# Patient Record
Sex: Male | Born: 1980 | Race: White | Hispanic: No | Marital: Single | Smoking: Current every day smoker
Health system: Southern US, Community
[De-identification: ages and names within clinical notes are randomized; demographics above are authoritative.]

---

## 2004-07-10 ENCOUNTER — Emergency Department: Payer: Self-pay | Admitting: Emergency Medicine

## 2005-10-19 ENCOUNTER — Emergency Department: Payer: Self-pay | Admitting: Emergency Medicine

## 2005-11-04 ENCOUNTER — Emergency Department: Payer: Self-pay | Admitting: Emergency Medicine

## 2006-01-02 ENCOUNTER — Emergency Department: Payer: Self-pay | Admitting: Unknown Physician Specialty

## 2006-05-31 ENCOUNTER — Emergency Department: Payer: Self-pay | Admitting: Emergency Medicine

## 2006-06-08 ENCOUNTER — Emergency Department: Payer: Self-pay | Admitting: Emergency Medicine

## 2006-07-17 ENCOUNTER — Emergency Department: Payer: Self-pay | Admitting: Internal Medicine

## 2006-12-05 ENCOUNTER — Emergency Department: Payer: Self-pay | Admitting: Emergency Medicine

## 2008-03-28 IMAGING — CR LEFT LITTLE FINGER 2+V
1 series · 3 of 3 positions shown · non-contrast
Comparison: none

REASON FOR EXAM: Fall
COMMENTS:   LMP: (Male)

[Series 1: view not recorded · 0.17mm/px · 3 of 3 slices shown]
[im 1/3]
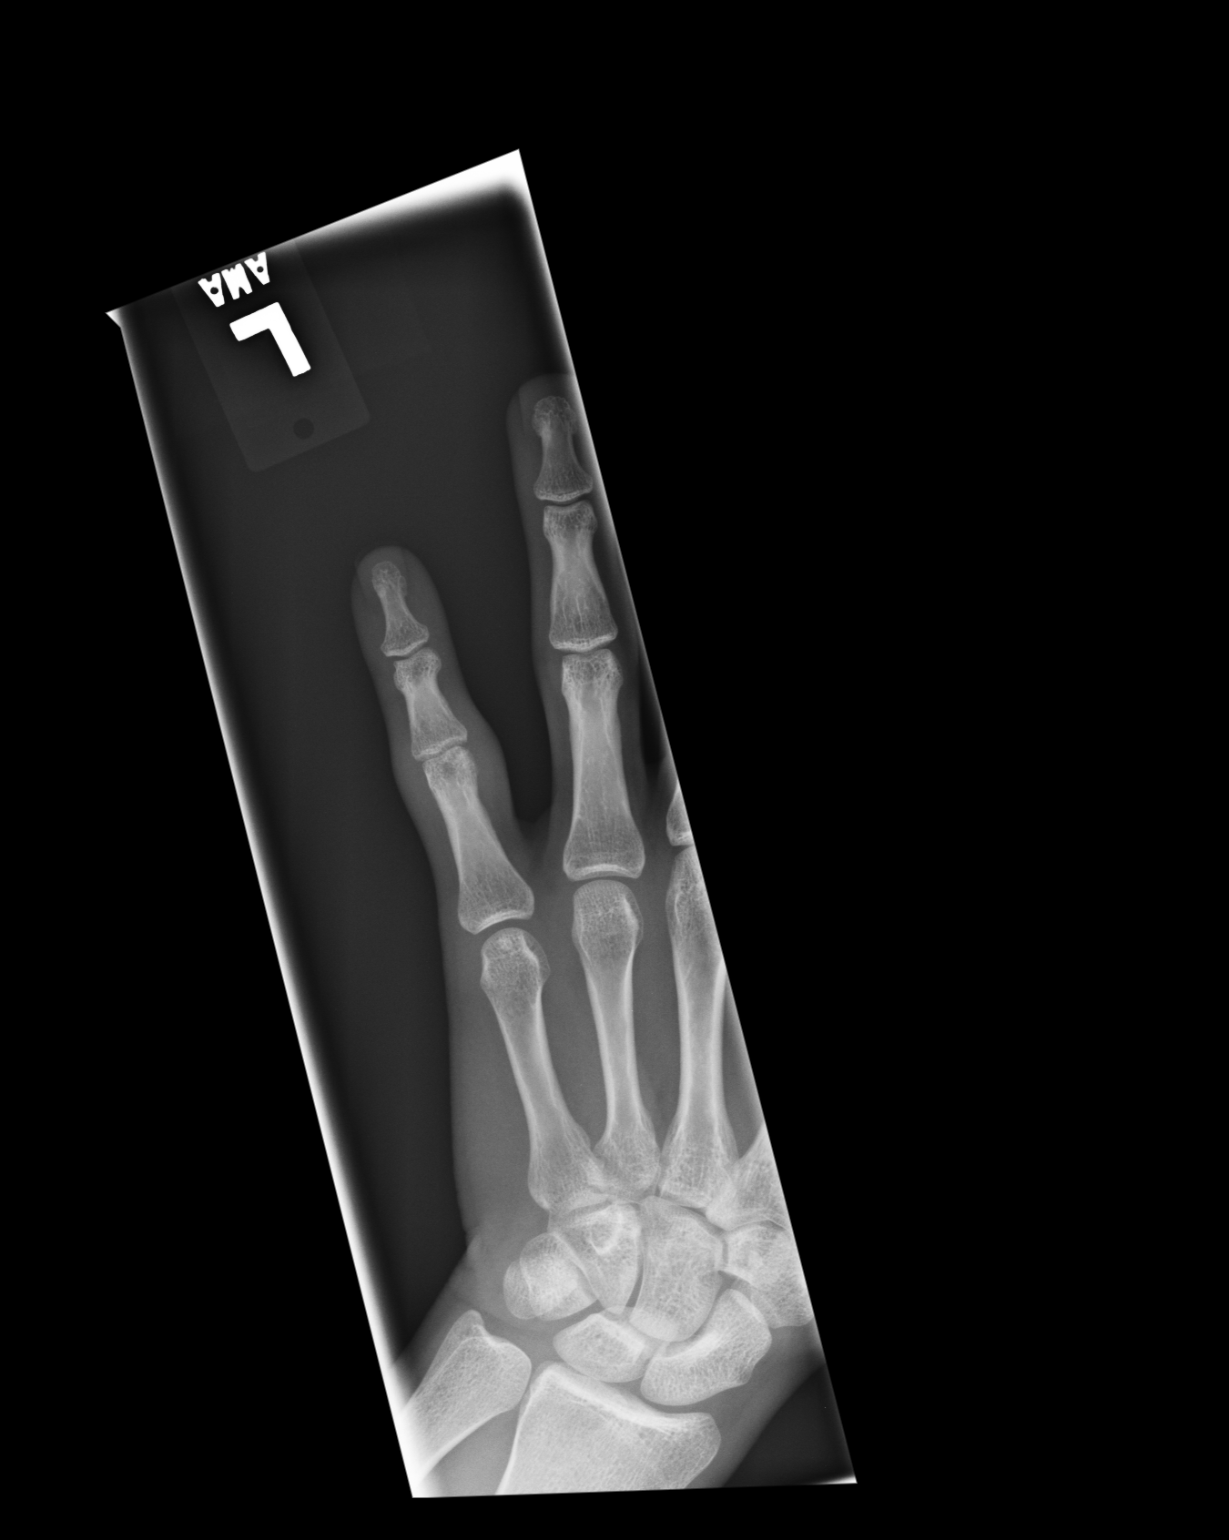
[im 2/3]
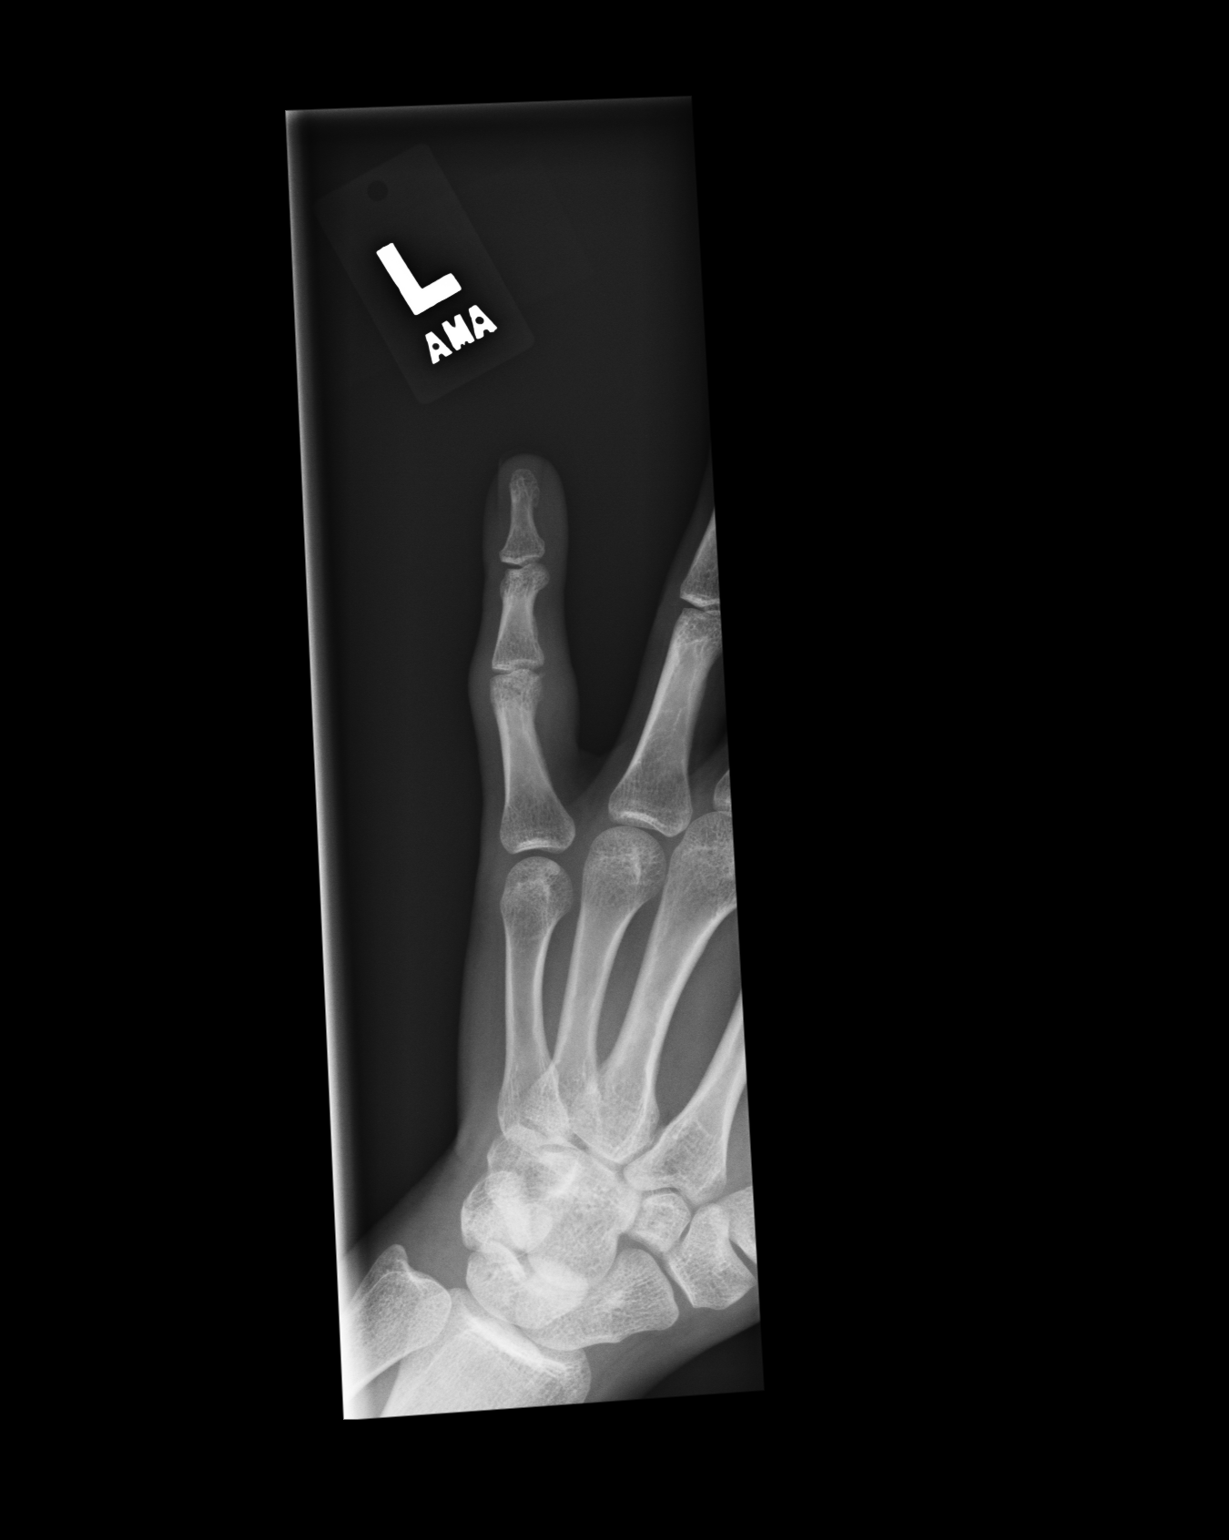
[im 3/3]
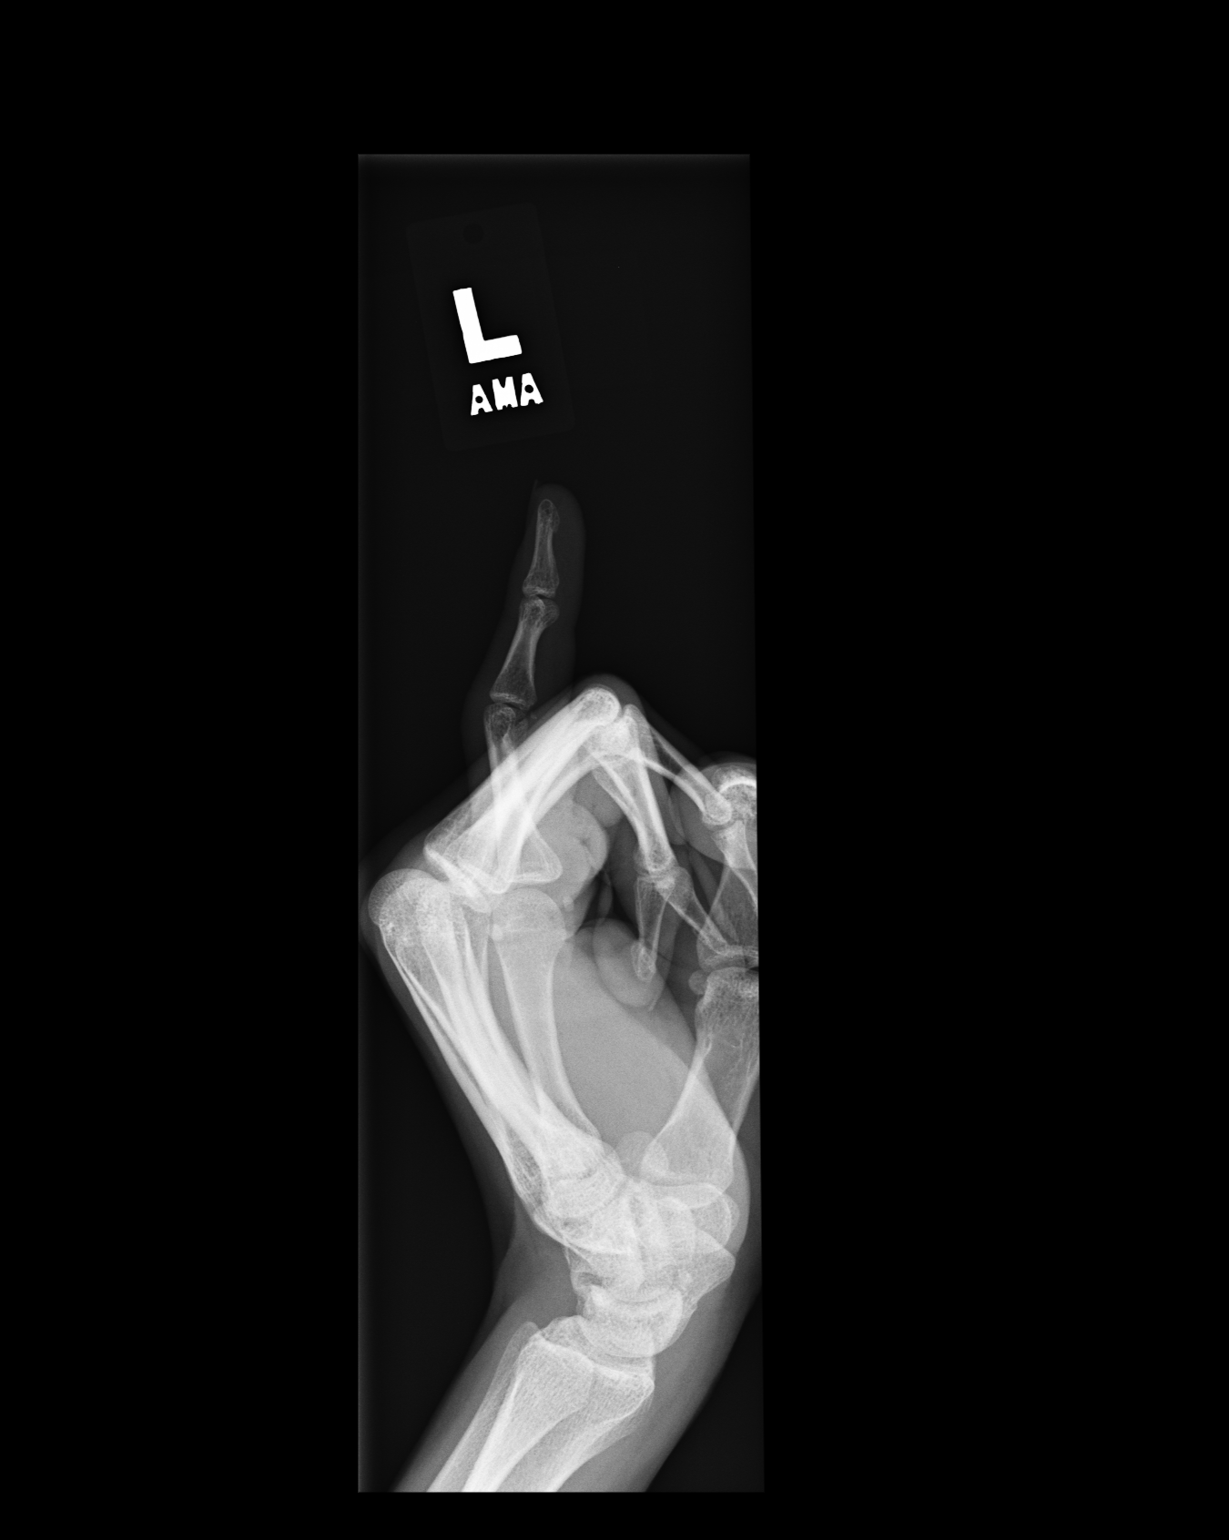

[3 of 3 positions shown; findings below may reference images not displayed]

PROCEDURE:     DXR - DXR FINGER PINKY 5TH DIGIT LT HA  - December 05, 2006  [DATE]

RESULT:     There is soft tissue swelling over the PIP joint of the LEFT
fifth finger. There may be a tiny avulsion fracture from the base of the
middle phalanx of the fifth finger seen on the lateral film. This can be
faintly demonstrated on the oblique view as well.
IMPRESSION: I am suspicious that there is a tiny avulsion from the base of the middle
phalanx of the LEFT fifth finger. There is considerable soft tissue swelling
over the PIP joint. Lucency is seen through the distal aspect of the
proximal phalanx on the oblique view but is not clearly evident on the other
films and is not felt to definitely reflect a fracture. Followup imaging is
needed to better assess the ventral aspect of the middle phalanx. The fourth
and third digits do overlap this region somewhat. A repeat lateral film is
available at no charge to the patient.

## 2008-03-28 IMAGING — CR DG CHEST 2V
1 series · 2 of 2 positions shown · non-contrast
Comparison: none

REASON FOR EXAM: Fall
COMMENTS:   LMP: (Male)

[Series 1: view not recorded · 0.17mm/px · 2 of 2 slices shown]
[im 1/2]
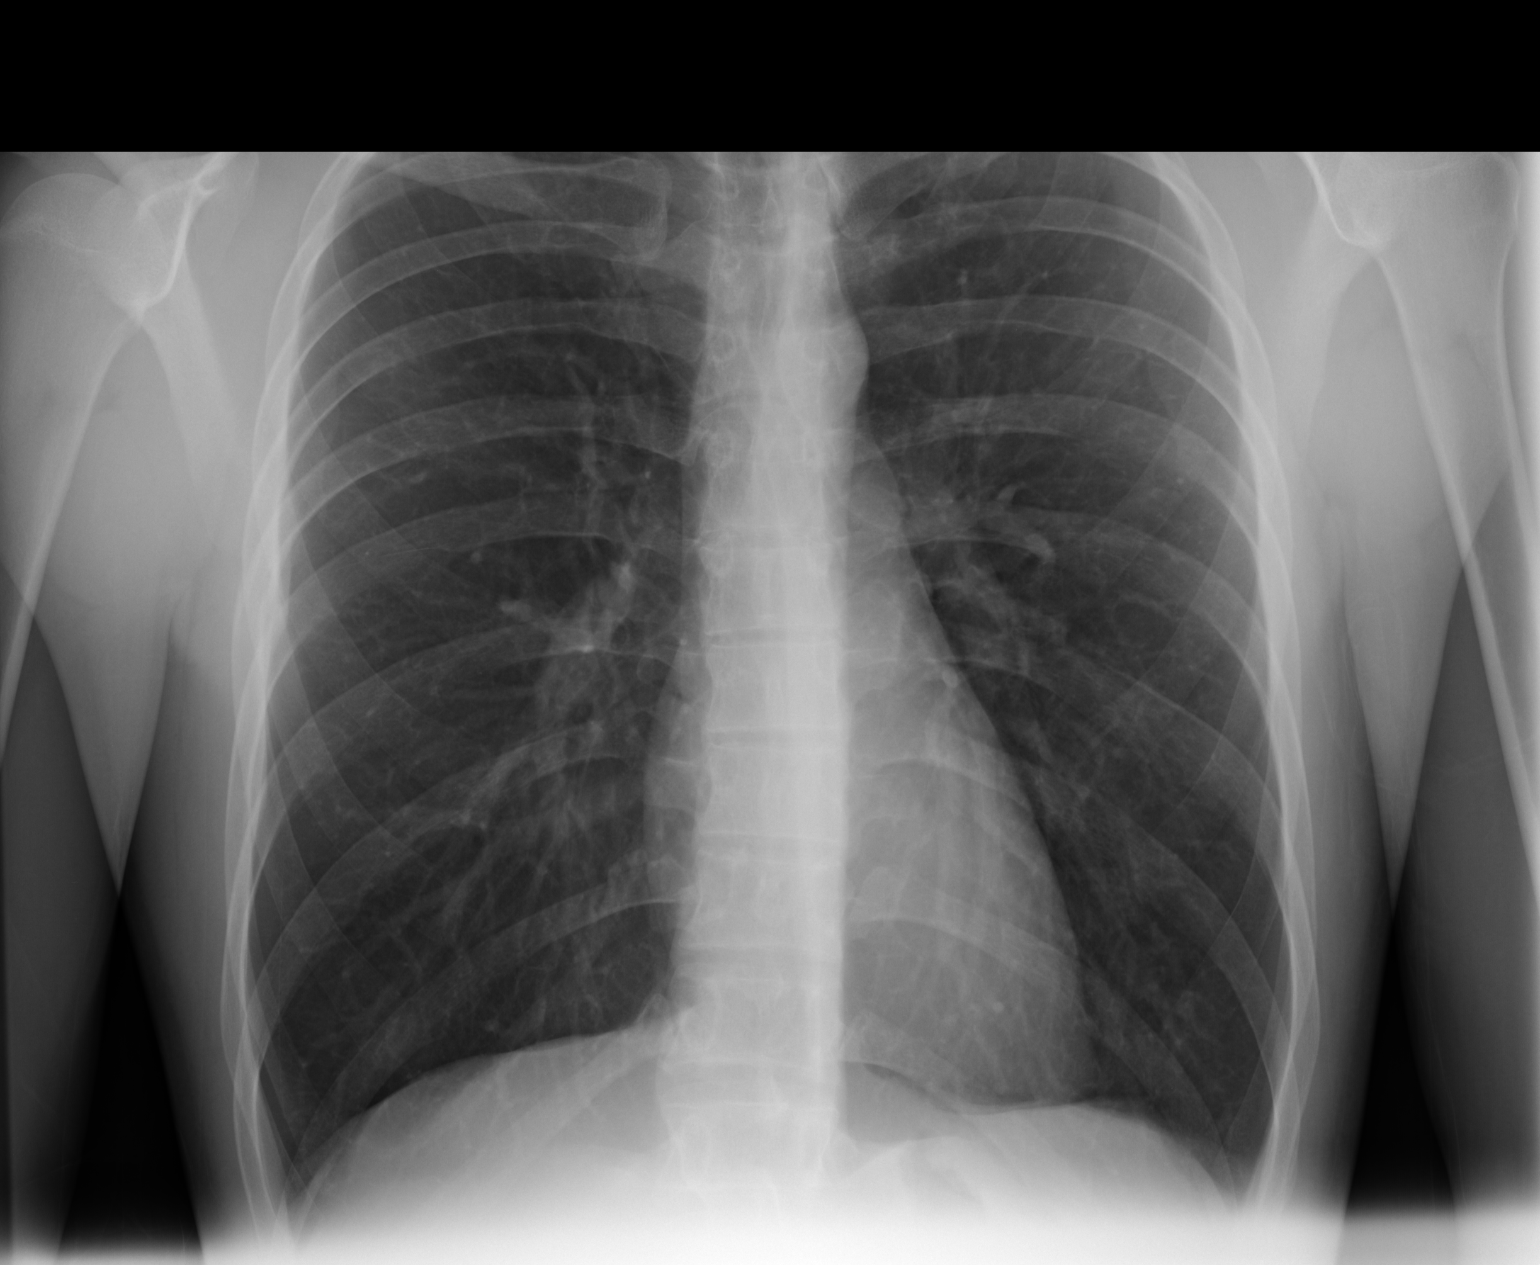
[im 2/2]
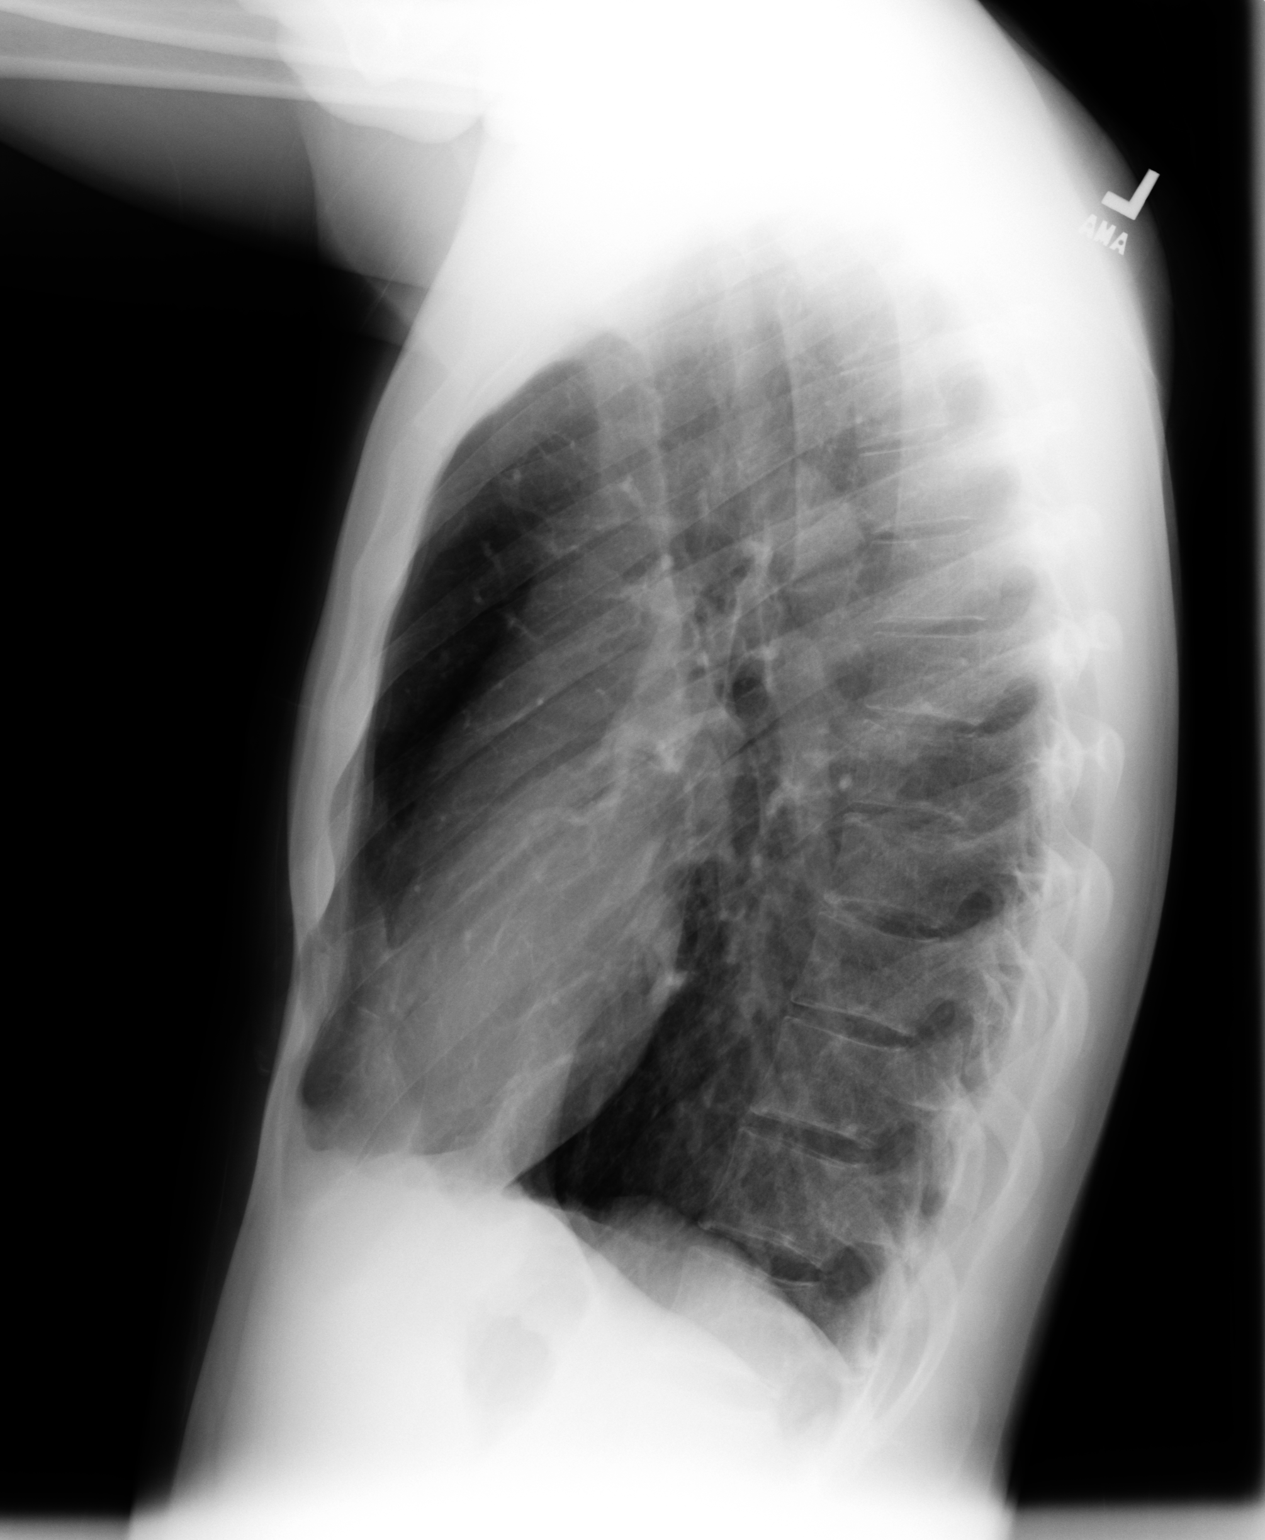

[2 of 2 positions shown; findings below may reference images not displayed]

PROCEDURE:     DXR - DXR CHEST PA (OR AP) AND LATERAL  - December 05, 2006  [DATE]

RESULT:     Comparison is made to the study of 11/04/05.  The patient
sustained injury in a fall.

The lungs are hyperinflated. There is no focal infiltrate. I see no evidence
of a pneumothorax or of a hemothorax. The mediastinum is not widened. The
cardiac silhouette is normal in size. The thoracic vertebral bodies are
preserved in height. No displaced rib fracture is seen though the complete
rib cage is not demonstrated.
IMPRESSION: There is hyperinflation consistent with reactive airway disease or deep
inspiratory effort. I do not see evidence of acute thoracic trauma.

## 2008-03-28 IMAGING — CR DG RIBS 2V*R*
1 series · 2 of 2 positions shown · non-contrast
Comparison: none

REASON FOR EXAM: fall
COMMENTS:   LMP: (Male)

[Series 1: view not recorded · 0.17mm/px · 2 of 2 slices shown]
[im 1/2]
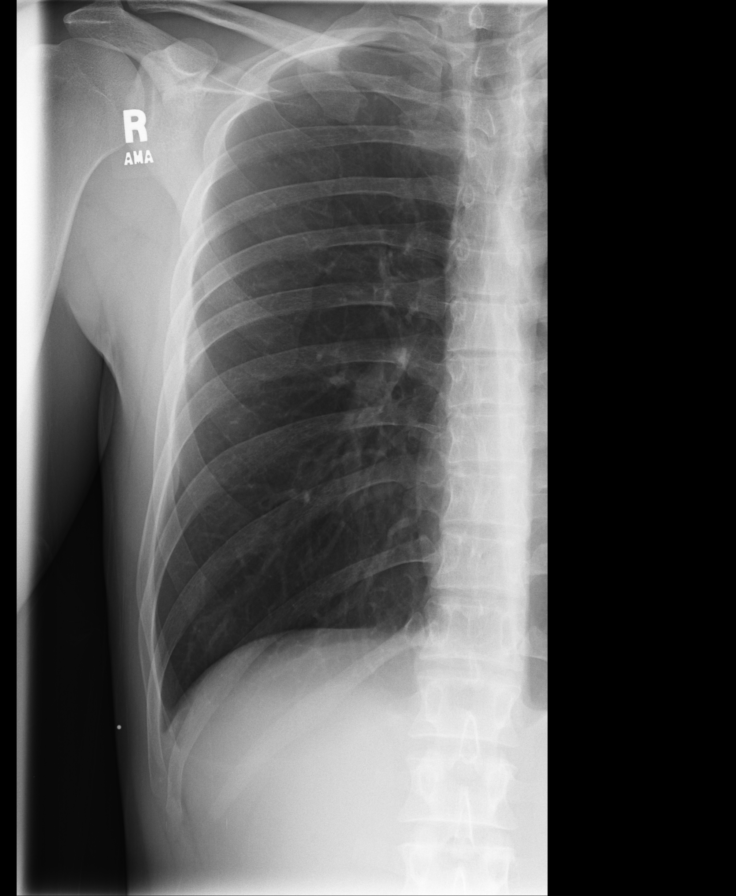
[im 2/2]
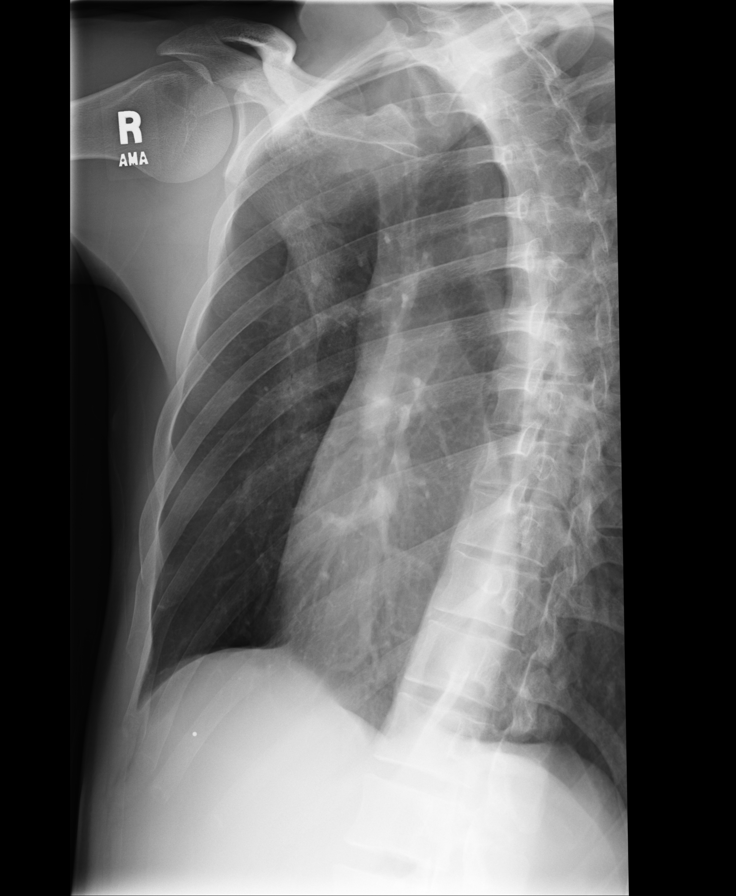

[2 of 2 positions shown; findings below may reference images not displayed]

PROCEDURE:     DXR - DXR RIBS RIGHT UNILATERAL  - December 05, 2006  [DATE]

RESULT:     There may be a subtle cortical disruption involving the lateral
aspect of the right 7th rib. It is seen only on the AP view. I do not see
definite fracture involving other ribs. There is no pneumothorax and no
evidence of a pleural effusion.
IMPRESSION: There may be a nondisplaced fracture of the lateral aspect
of the right 7th rib. I see no pneumothorax or hemothorax.

## 2009-12-17 ENCOUNTER — Emergency Department: Payer: Self-pay | Admitting: Unknown Physician Specialty

## 2013-03-07 ENCOUNTER — Emergency Department: Payer: Self-pay | Admitting: Emergency Medicine

## 2013-12-04 ENCOUNTER — Emergency Department: Payer: Self-pay | Admitting: Emergency Medicine

## 2014-06-29 IMAGING — CR LEFT FOURTH TOE
1 series · 3 of 3 positions shown · non-contrast
Comparison: none

REASON FOR EXAM: hit yesterday, swelling, painful WB, TTP
COMMENTS:

PROCEDURE:     DXR - DXR TOE 4TH DIGIT LEFT FOOT  - March 07, 2013 [DATE]
RESULT:     Comparison: None.

[Series 1: ap · 0.17mm/px · 3 of 3 slices shown]
[im 1/3]
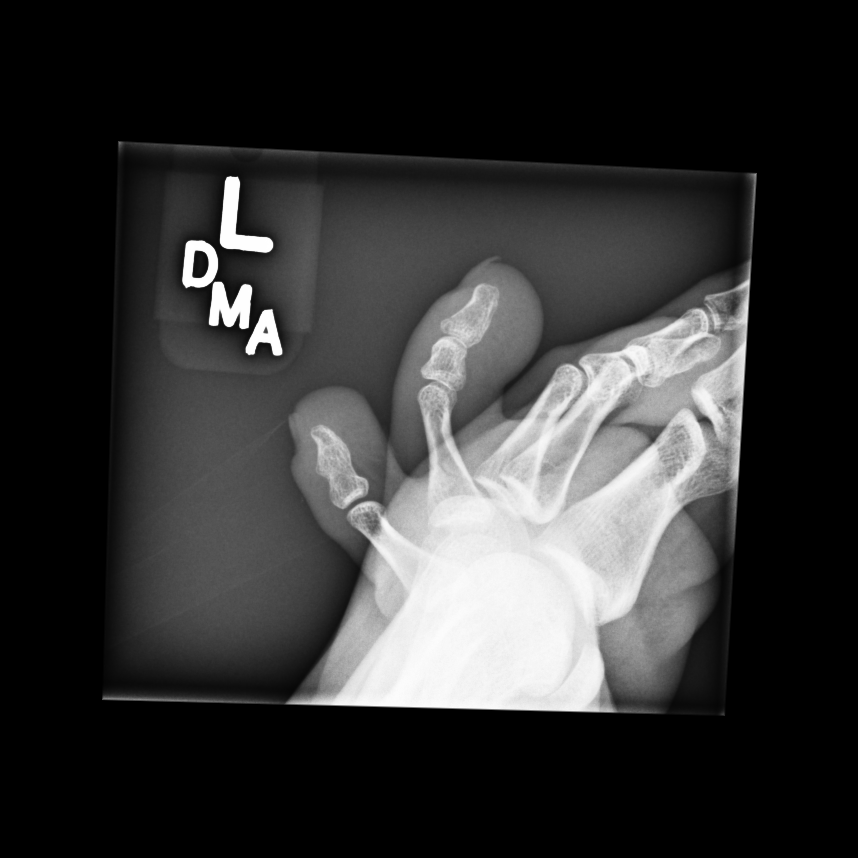
[im 2/3]
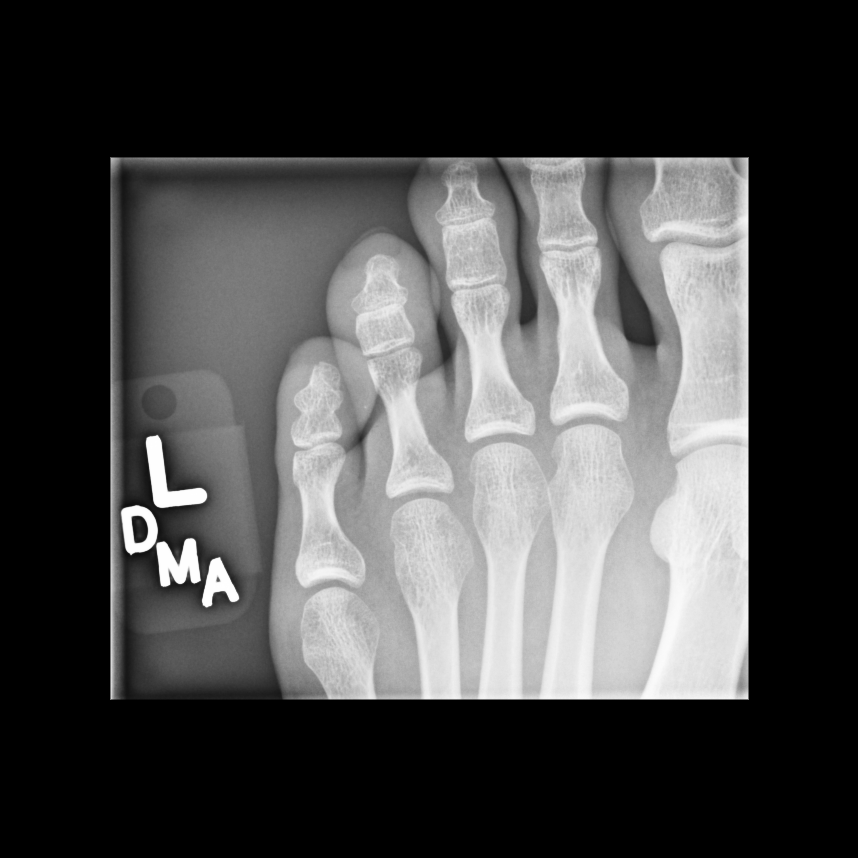
[im 3/3]
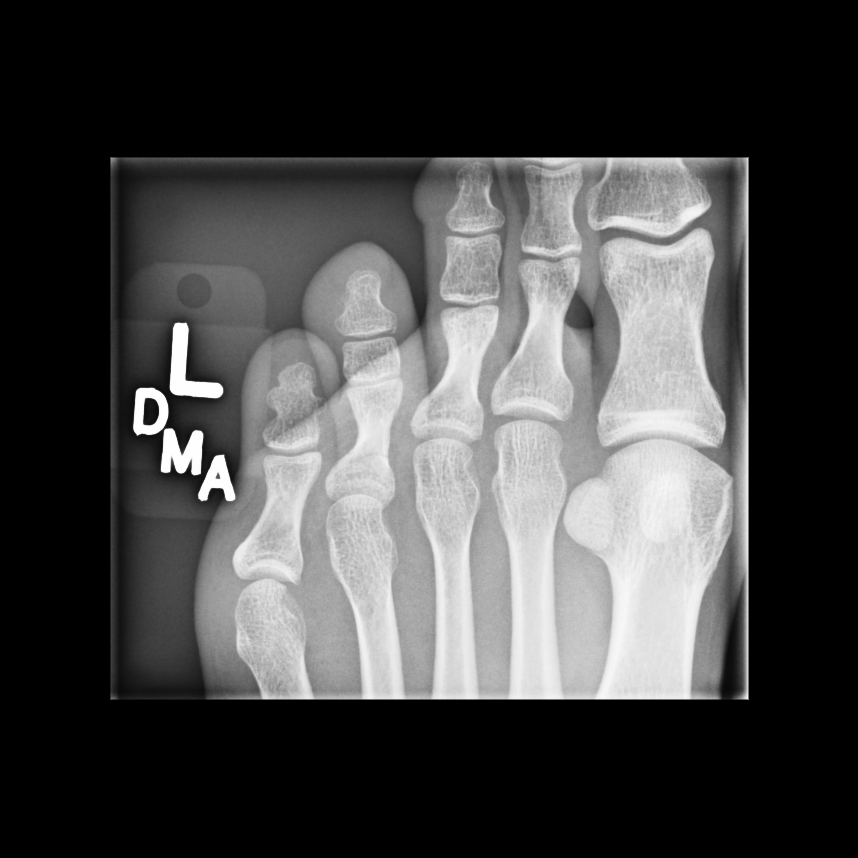

[3 of 3 positions shown; findings below may reference images not displayed]

FINDINGS: There is a nondisplaced fracture of the proximal phalanx of the fourth toe.
IMPRESSION: Nondisplaced fracture of the proximal phalanx fourth toe.

[REDACTED]

## 2015-03-28 IMAGING — CR DG RIBS 2V*R*
1 series · 5 of 5 positions shown · non-contrast
Comparison: Chest radiograph December 05, 2006

CLINICAL DATA: Pain post trauma

EXAM:
RIGHT RIBS - 4 VIEW

[Series 1: w chest pa · 0.14mm/px · 5 of 5 slices shown]
[im 1/5]
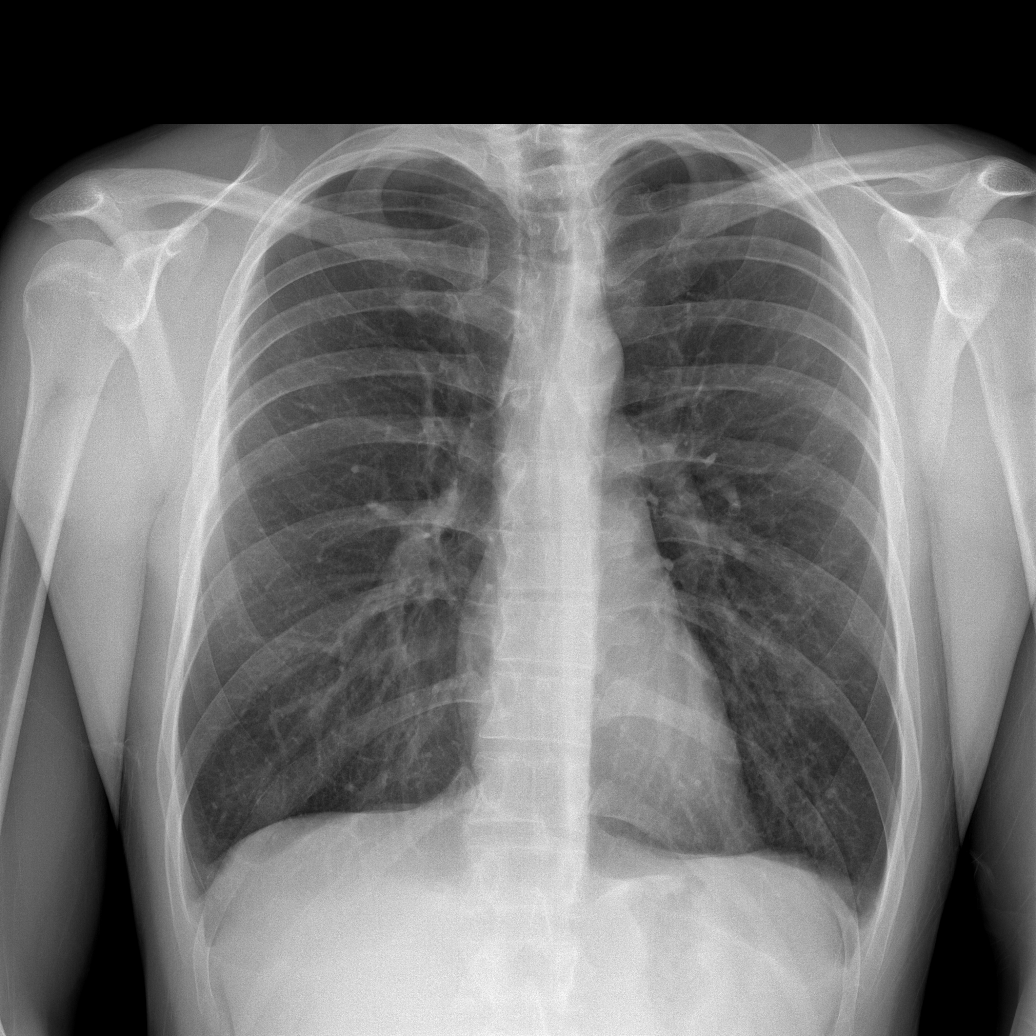
[im 2/5]
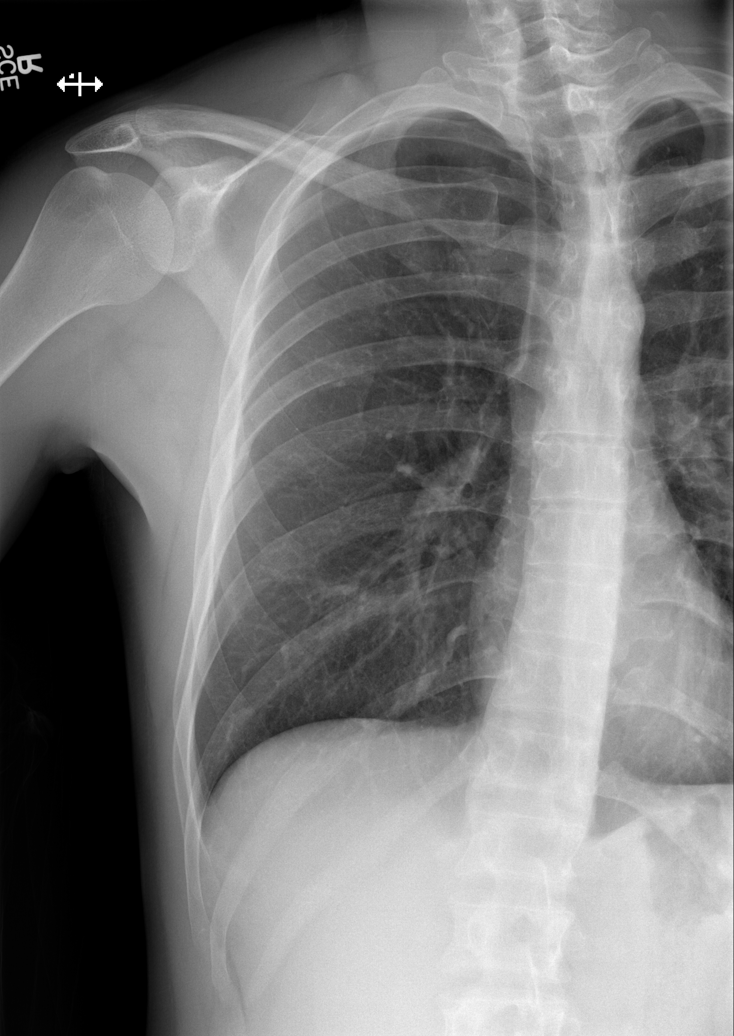
[im 3/5]
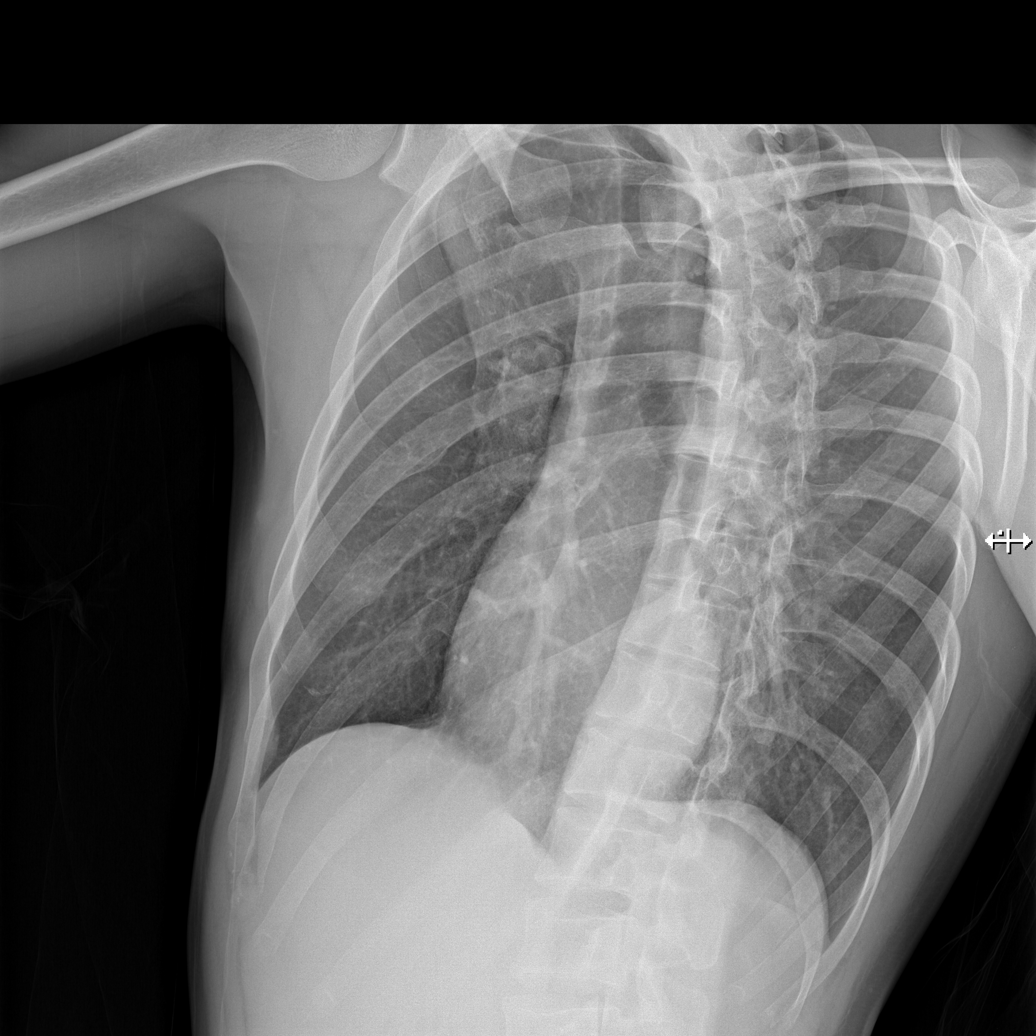
[im 4/5]
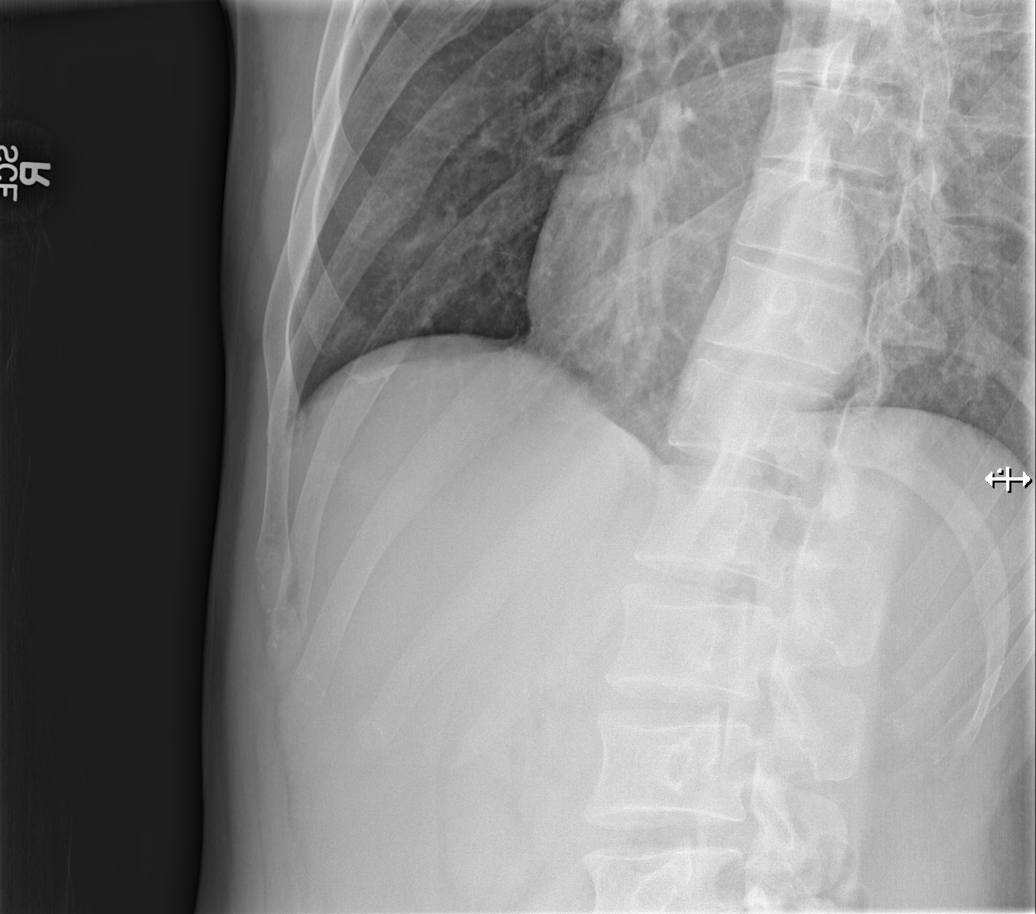
[im 5/5]
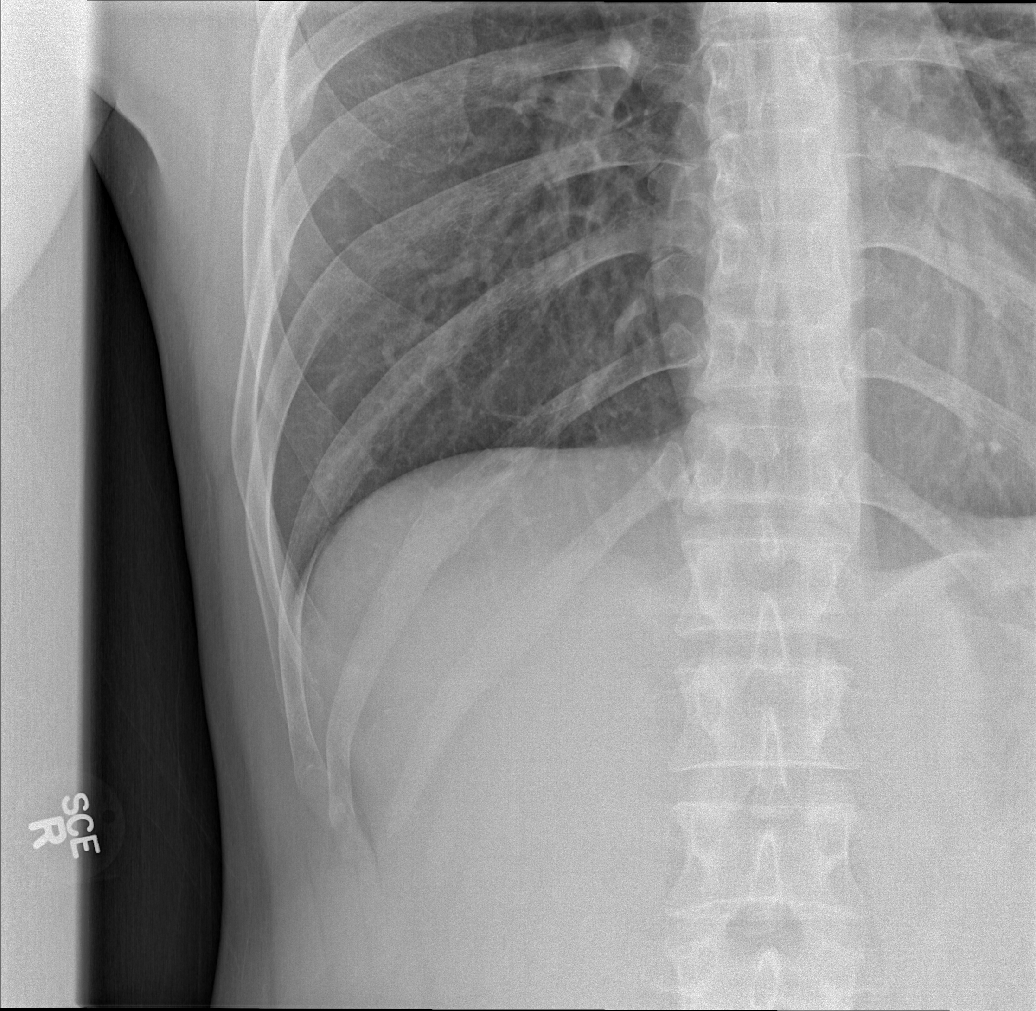

[5 of 5 positions shown; findings below may reference images not displayed]

FINDINGS: Frontal chest as well as bilateral oblique and cone-down lower rib
images were obtained. Lungs are clear. Heart size and pulmonary
vascularity are normal. No adenopathy.

There is mild upper thoracic levoscoliosis. There is evidence of old
healed fractures of the lateral right seventh and eighth ribs.

There is no appreciable pleural effusion or pneumothorax. There is a
rather subtle nondisplaced acute fracture of the right posterior
eleventh rib.
IMPRESSION: Nondisplaced fracture of the right posterior eleventh rib, acute.
Old fractures of lateral seventh and eighth ribs the right. No
pneumothorax. Lungs clear.

## 2020-10-02 ENCOUNTER — Emergency Department
Admission: EM | Admit: 2020-10-02 | Discharge: 2020-10-02 | Disposition: A | Discharge status: Home / Self Care | Payer: Self-pay | Attending: Emergency Medicine | Admitting: Emergency Medicine

## 2020-10-02 ENCOUNTER — Other Ambulatory Visit: Payer: Self-pay

## 2020-10-02 DIAGNOSIS — F17210 Nicotine dependence, cigarettes, uncomplicated: Secondary | ICD-10-CM | POA: Insufficient documentation

## 2020-10-02 DIAGNOSIS — T50901A Poisoning by unspecified drugs, medicaments and biological substances, accidental (unintentional), initial encounter: Secondary | ICD-10-CM | POA: Insufficient documentation

## 2020-10-02 LAB — CBG MONITORING, ED: Glucose-Capillary: 87 mg/dL (ref 70–99)

## 2020-10-02 NOTE — ED Triage Notes (Signed)
Pt found unresponsive by fire w weak carotid pulses, pinpoint pupils, bagged him, gave 1 of narcan. Pupils normal now. Cooperative at this time. Pt friend called [phone]. RR n18 HR 95

## 2020-10-02 NOTE — ED Provider Notes (Addendum)
[facility] Emergency Department Provider Note  ____________________________________________   Event Date/Time   First MD Initiated Contact with Patient 10/02/20 [phone]     (approximate)  I have reviewed the triage vital signs and the nursing notes.   HISTORY  Chief Complaint Drug Overdose    HPI Tyrone Shelton is a 40 y.o. male history of substance abuse who presents to the emergency department with EMS after an accidental drug overdose.  He states at some point last night he took several Xanax and Suboxone in an attempt to get high.  He also drink alcohol.  Reports he had a friend over at the house helping him clean the house.  Friend called [phone] patient was unresponsive, apneic.  Patient received Narcan with EMS and was bagged.  He was not pulseless.  He is now awake and alert without any complaints.  He denies that this was an attempt to harm himself.  Patient tells me that his fiance is currently in the hospital.  She is admitted for DKA.  He was at home with his friend and his 95-year-old daughter.  Currently the 58-year-old daughter, Tyrone Shelton, is with the sheriff's department.        History reviewed. No pertinent past medical history.  There are no problems to display for this patient.   History reviewed. No pertinent surgical history.  Prior to Admission medications   Not on File    Allergies Patient has no allergy information on record.  No family history on file.  Social History Social History   Tobacco Use  . Smoking status: Current Every Day Smoker    Packs/day: 0.50    Types: Cigarettes  . Smokeless tobacco: Never Used  Substance Use Topics  . Alcohol use: Yes    Alcohol/week: 4.0 standard drinks    Types: 4 Cans of beer per week  . Drug use: Yes    Types: Marijuana    Review of Systems Constitutional: No fever. Eyes: No visual changes. ENT: No sore throat. Cardiovascular: Denies chest pain. Respiratory: Denies  shortness of breath. Gastrointestinal: No nausea, vomiting, diarrhea. Genitourinary: Negative for dysuria. Musculoskeletal: Negative for back pain. Skin: Negative for rash. Neurological: Negative for focal weakness or numbness.  ____________________________________________   PHYSICAL EXAM:  VITAL SIGNS: ED Triage Vitals [10/02/20 0341]  Enc Vitals Group     BP 130/88     Pulse Rate 99     Resp 14     Temp 97.6 F (36.4 C)     Temp Source Oral     SpO2 98 %     Weight 160 lb (72.6 kg)     Height 6' (1.829 m)     Head Circumference      Peak Flow      Pain Score 0     Pain Loc      Pain Edu?      Excl. in GC?    CONSTITUTIONAL: Alert and oriented and responds appropriately to questions. Well-appearing; well-nourished HEAD: Normocephalic EYES: Conjunctivae clear, pupils appear equal, EOM appear intact ENT: normal nose; moist mucous membranes NECK: Supple, normal ROM CARD: RRR; S1 and S2 appreciated; no murmurs, no clicks, no rubs, no gallops RESP: Normal chest excursion without splinting or tachypnea; breath sounds clear and equal bilaterally; no wheezes, no rhonchi, no rales, no hypoxia or respiratory distress, speaking full sentences ABD/GI: Normal bowel sounds; non-distended; soft, non-tender, no rebound, no guarding, no peritoneal signs, no hepatosplenomegaly BACK: The back appears normal  EXT: Normal ROM in all joints; no deformity noted, no edema; no cyanosis SKIN: Normal color for age and race; warm; no rash on exposed skin NEURO: Moves all extremities equally PSYCH: The patient's mood and manner are appropriate.  Denies SI.  ____________________________________________   LABS (all labs ordered are listed, but only abnormal results are displayed)  Labs Reviewed  CBG MONITORING, ED   ____________________________________________  EKG  None ____________________________________________  RADIOLOGY I, Joliene Salvador, personally viewed and evaluated these images  (plain radiographs) as part of my medical decision making, as well as reviewing the written report by the radiologist.  ED MD interpretation:  none  Official radiology report(s): No results found.  ____________________________________________   PROCEDURES  Procedure(s) performed (including Critical Care):  Procedures  CRITICAL CARE Performed by: Rochele Raring   Total critical care time: 35 minutes  Critical care time was exclusive of separately billable procedures and treating other patients.  Critical care was necessary to treat or prevent imminent or life-threatening deterioration.  Critical care was time spent personally by me on the following activities: development of treatment plan with patient and/or surrogate as well as nursing, discussions with consultants, evaluation of patient's response to treatment, examination of patient, obtaining history from patient or surrogate, ordering and performing treatments and interventions, ordering and review of laboratory studies, ordering and review of radiographic studies, pulse oximetry and re-evaluation of patient's condition.  ____________________________________________   INITIAL IMPRESSION / ASSESSMENT AND PLAN / ED COURSE  As part of my medical decision making, I reviewed the following data within the electronic MEDICAL RECORD NUMBER History obtained from family, Labs reviewed, Old chart reviewed, Notes from prior ED visits and [place] Controlled Substance Database         Patient here after accidental overdose.  Will monitor here in the ED.  Will allow him to eat and drink.  He has no complaints at this time and is hemodynamically stable.  Will check blood glucose.  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  I will contact CPS.  This chart will not be shared with patient given concerns for  daughter safety.  4:20 AM  Discussed with Morrie Sheldon with [place] [employer] regarding patient's daughter Tyrone Shelton.  Confirmed with nurse that  [employer] is with child.   5:45 AM  Pt has been monitored for over 2 hours.  Eating and drinking without difficulty.  Normal blood glucose.  No complaints at this time.  Hemodynamically stable.  No apnea, unresponsiveness, hypoxia. Ambulates without difficulty with quick and steady gait. Will discharge with outpatient resources.  At this time, I do not feel there is any life-threatening condition present. I have reviewed, interpreted and discussed all results (EKG, imaging, lab, urine as appropriate) and exam findings with patient/family. I have reviewed nursing notes and appropriate previous records.  I feel the patient is safe to be discharged home without further emergent workup and can continue workup as an outpatient as needed. Discussed usual and customary return precautions. Patient/family verbalize understanding and are comfortable with this plan.  Outpatient follow-up has been provided as needed. All questions have been answered.  ____________________________________________   FINAL CLINICAL IMPRESSION(S) / ED DIAGNOSES  Final diagnoses:  Accidental overdose, initial encounter     ED Discharge Orders    None      *Please note:  RAEQWON LUX was evaluated  in Emergency Department on 10/02/2020 for the symptoms described in the history of present illness. He was evaluated in the context of the global COVID-19 pandemic, which necessitated consideration that the patient might be at risk for infection with the SARS-CoV-2 virus that causes COVID-19. Institutional protocols and algorithms that pertain to the evaluation of patients at risk for COVID-19 are in a state of rapid change based on information released by regulatory bodies including the CDC and federal and state organizations. These policies and algorithms were followed during  the patient's care in the ED.  Some ED evaluations and interventions may be delayed as a result of limited staffing during and the pandemic.*   Note:  This document was prepared using Dragon voice recognition software and may include unintentional dictation errors.   Lougenia Morrissey, Layla Maw, DO 10/02/20 0545    Taci Sterling, Layla Maw, DO 10/02/20 [phone]

## 2020-10-02 NOTE — ED Notes (Signed)
Patient reports taking xanax today

## 2023-10-01 ENCOUNTER — Emergency Department
Admission: EM | Admit: 2023-10-01 | Discharge: 2023-10-01 | Disposition: A | Discharge status: Home / Self Care | Payer: Self-pay | Attending: Emergency Medicine | Admitting: Emergency Medicine

## 2023-10-01 ENCOUNTER — Other Ambulatory Visit: Payer: Self-pay

## 2023-10-01 DIAGNOSIS — L03031 Cellulitis of right toe: Secondary | ICD-10-CM | POA: Insufficient documentation

## 2023-10-01 DIAGNOSIS — L02611 Cutaneous abscess of right foot: Secondary | ICD-10-CM | POA: Insufficient documentation

## 2023-10-01 MED ORDER — CEPHALEXIN 500 MG PO CAPS
500.0000 mg | ORAL_CAPSULE | Freq: Once | ORAL | Status: AC
  Administered 2023-10-01: 500 mg via ORAL
  Filled 2023-10-01: qty 1

## 2023-10-01 MED ORDER — BACITRACIN ZINC 500 UNIT/GM EX OINT
TOPICAL_OINTMENT | CUTANEOUS | Status: AC
  Administered 2023-10-01: 1 via TOPICAL
  Filled 2023-10-01: qty 0.9

## 2023-10-01 MED ORDER — BACITRACIN ZINC 500 UNIT/GM EX OINT: TOPICAL_OINTMENT | Freq: Two times a day (BID) | CUTANEOUS | Status: DC

## 2023-10-01 MED ORDER — LIDOCAINE-PRILOCAINE 2.5-2.5 % EX CREA
TOPICAL_CREAM | CUTANEOUS | Status: AC
  Filled 2023-10-01: qty 5

## 2023-10-01 MED ORDER — SULFAMETHOXAZOLE-TRIMETHOPRIM 800-160 MG PO TABS
1.0000 | ORAL_TABLET | Freq: Once | ORAL | Status: AC
  Administered 2023-10-01: 1 via ORAL
  Filled 2023-10-01: qty 1

## 2023-10-01 MED ORDER — SULFAMETHOXAZOLE-TRIMETHOPRIM 800-160 MG PO TABS: 1.0000 | ORAL_TABLET | Freq: Two times a day (BID) | ORAL | 0 refills | Status: AC

## 2023-10-01 MED ORDER — CEFADROXIL 500 MG PO CAPS: 500.0000 mg | ORAL_CAPSULE | Freq: Two times a day (BID) | ORAL | 0 refills | Status: AC

## 2023-10-01 NOTE — ED Provider Notes (Addendum)
[facility] Provider Note    Event Date/Time   First MD Initiated Contact with Patient 10/01/23 [phone]     (approximate)   History   Toe Pain   HPI Tyrone Shelton is a 43 y.o. male who presents for evaluation of a possible toe infection.  He states that he does a lot of work on his feet and he developed a lesion on his right little toe that has turned white and has a bubble and there is some surrounding redness.  He is concerned that he might have parasites because he said he has little black specks and sometimes white specks all over his body and he can see bugs when he pulls them out.  He denies drug use.  He has not had any itching but he points to several spots on his arms that he says are from bites and he is not sure if something bit him on his toe.  No fever, chest pain, shortness of breath, nausea, vomiting.  He said it hurts when he walks.  He has had no recent trauma.     Physical Exam   Triage Vital Signs: ED Triage Vitals  Encounter Vitals Group     BP 10/01/23 0114 137/87     Systolic BP Percentile --      Diastolic BP Percentile --      Pulse Rate 10/01/23 0114 88     Resp 10/01/23 0114 16     Temp 10/01/23 0114 98.3 F (36.8 C)     Temp Source 10/01/23 0114 Oral     SpO2 10/01/23 0114 97 %     Weight 10/01/23 0112 72.6 kg (160 lb)     Height 10/01/23 0112 1.829 m (6')     Head Circumference --      Peak Flow --      Pain Score 10/01/23 0112 6     Pain Loc --      Pain Education --      Exclude from Growth Chart --     Most recent vital signs: Vitals:   10/01/23 0114  BP: 137/87  Pulse: 88  Resp: 16  Temp: 98.3 F (36.8 C)  SpO2: 97%    General: Awake, no distress.  Disheveled and somewhat dirty but generally appropriate appearance. CV:  Good peripheral perfusion.  Resp:  Normal effort. Speaking easily and comfortably, no accessory muscle usage nor intercostal retractions.   Abd:  No distention.  Other:  Patient has a  blister on his right little toe that appears to be filled with pus.  There is some surrounding localized cellulitis that does not extend up the foot.     ED Results / Procedures / Treatments   Labs (all labs ordered are listed, but only abnormal results are displayed) Labs Reviewed  AEROBIC/ANAEROBIC CULTURE W GRAM STAIN (SURGICAL/DEEP WOUND)    PROCEDURES:  Critical Care performed: No  .Incision and Drainage  Date/Time: 10/01/2023 4:09 AM  Performed by: Loleta Rose, MD Authorized by: Loleta Rose, MD   Consent:    Consent obtained:  Verbal   Consent given by:  Patient   Risks discussed:  Bleeding and incomplete drainage   Alternatives discussed:  No treatment Universal protocol:    Patient identity confirmed:  Verbally with patient Location:    Type:  Abscess   Size:  1-cm   Location:  Lower extremity   Lower extremity location:  Toe   Toe location:  R little toe  Pre-procedure details:    Skin preparation:  Povidone-iodine Sedation:    Sedation type:  None Anesthesia:    Anesthesia method:  Topical application   Topical anesthetic:  EMLA cream Procedure type:    Complexity:  Simple Procedure details:    Incision types:  Single straight   Wound management:  Probed and deloculated   Drainage:  Bloody and purulent   Drainage amount:  Copious   Wound treatment:  Wound left open   Packing materials:  None Post-procedure details:    Procedure completion:  Tolerated well, no immediate complications     IMPRESSION / MDM / ASSESSMENT AND PLAN / ED COURSE  I reviewed the triage vital signs and the nursing notes.                              Differential diagnosis includes, but is not limited to, cellulitis, abscess, blister, insect bite, less likely fracture or osteomyelitis  Patient's presentation is most consistent with acute complicated illness / injury requiring diagnostic workup.  Labs/studies ordered: Wound culture  Interventions/Medications given:   Medications  bacitracin ointment (has no administration in time range)  lidocaine-prilocaine (EMLA) cream ( Topical Given 10/01/23 0254)  sulfamethoxazole-trimethoprim (BACTRIM DS) 800-160 MG per tablet 1 tablet (1 tablet Oral Given 10/01/23 0254)  cephALEXin (KEFLEX) capsule 500 mg (500 mg Oral Given 10/01/23 0254)    (Note:  hospital course my include additional interventions and/or labs/studies not listed above.)   Patient has what appears to be a purulent superficial abscess or blister on his foot.  Does not appear consistent with any sort of bite wound.  Given that there is some surrounding cellulitis I will treat with both Bactrim and a cephalosporin as listed above and below, first dose in the ED.  I I&D the wound and had copious purulent discharge and the patient said he feels better now.  I encouraged outpatient follow-up with podiatry and gave my usual and customary return precautions.  Wound culture pending.         FINAL CLINICAL IMPRESSION(S) / ED DIAGNOSES   Final diagnoses:  Abscess or cellulitis, toe, right     Rx / DC Orders   ED Discharge Orders          Ordered    sulfamethoxazole-trimethoprim (BACTRIM DS) 800-160 MG tablet  2 times daily        10/01/23 0411    cefadroxil (DURICEF) 500 MG capsule  2 times daily        10/01/23 0411             Note:  This document was prepared using Dragon voice recognition software and may include unintentional dictation errors.   Loleta Rose, MD 10/01/23 [id number]    Loleta Rose, MD 10/01/23 [id number]    Loleta Rose, MD 10/01/23 [phone]

## 2023-10-01 NOTE — Discharge Instructions (Addendum)
Please keep your wound clean and dry.  Take the full course of antibiotics as prescribed (both antibiotics for 10 days).  Please call the foot specialist to schedule a follow-up appointment.  Return to the emergency department if you develop new or worsening symptoms that concern you.

## 2023-10-01 NOTE — ED Triage Notes (Addendum)
Pt to ed from home via POV for a possible toe injury/infection. Pt states "I thought I had broke my toe a few days ago and I worked on it and its now getting worse and I have redness coming up my leg". Pt is caox4, in no acute distress and ambulatory in triage. Pt denies hitting his toe on anything. When pt removed sock, pt has what appears to be a blister/abscess on his right pinky toe. Foot is not red and is not hot to the touch. Pt denies being diabetic.

## 2023-10-06 LAB — AEROBIC/ANAEROBIC CULTURE W GRAM STAIN (SURGICAL/DEEP WOUND): Gram Stain: NONE SEEN
